# Patient Record
Sex: Male | Born: 1990 | Race: White | Hispanic: No | Marital: Single | Smoking: Never smoker
Health system: Southern US, Community
[De-identification: ages and names within clinical notes are randomized; demographics above are authoritative.]

---

## 2011-03-15 ENCOUNTER — Emergency Department (HOSPITAL_COMMUNITY)
Admission: EM | Admit: 2011-03-15 | Discharge: 2011-03-15 | Disposition: A | Discharge status: Nursing Home (Includes ICF) | Payer: BC Managed Care – PPO | Source: Home / Self Care

## 2011-03-15 ENCOUNTER — Encounter: Payer: Self-pay | Admitting: *Deleted

## 2011-03-15 ENCOUNTER — Encounter (HOSPITAL_COMMUNITY): Payer: Self-pay | Admitting: *Deleted

## 2011-03-15 ENCOUNTER — Emergency Department (HOSPITAL_COMMUNITY): Payer: BC Managed Care – PPO

## 2011-03-15 ENCOUNTER — Emergency Department (HOSPITAL_COMMUNITY)
Admission: EM | Admit: 2011-03-15 | Discharge: 2011-03-15 | Disposition: A | Discharge status: Home / Self Care | Payer: BC Managed Care – PPO | Attending: Emergency Medicine | Admitting: Emergency Medicine

## 2011-03-15 DIAGNOSIS — K1120 Sialoadenitis, unspecified: Secondary | ICD-10-CM | POA: Insufficient documentation

## 2011-03-15 DIAGNOSIS — R221 Localized swelling, mass and lump, neck: Secondary | ICD-10-CM

## 2011-03-15 DIAGNOSIS — R51 Headache: Secondary | ICD-10-CM | POA: Insufficient documentation

## 2011-03-15 DIAGNOSIS — R220 Localized swelling, mass and lump, head: Secondary | ICD-10-CM | POA: Insufficient documentation

## 2011-03-15 DIAGNOSIS — J3489 Other specified disorders of nose and nasal sinuses: Secondary | ICD-10-CM | POA: Insufficient documentation

## 2011-03-15 DIAGNOSIS — R5381 Other malaise: Secondary | ICD-10-CM | POA: Insufficient documentation

## 2011-03-15 DIAGNOSIS — R509 Fever, unspecified: Secondary | ICD-10-CM | POA: Insufficient documentation

## 2011-03-15 DIAGNOSIS — R259 Unspecified abnormal involuntary movements: Secondary | ICD-10-CM | POA: Insufficient documentation

## 2011-03-15 MED ORDER — HYDROCODONE-ACETAMINOPHEN 5-325 MG PO TABS
ORAL_TABLET | ORAL | Status: AC
  Filled 2011-03-15: qty 1

## 2011-03-15 MED ORDER — HYDROCODONE-ACETAMINOPHEN 5-500 MG PO TABS: 1.0000 | ORAL_TABLET | Freq: Four times a day (QID) | ORAL | Status: AC | PRN

## 2011-03-15 MED ORDER — AMOXICILLIN-POT CLAVULANATE 875-125 MG PO TABS: 1.0000 | ORAL_TABLET | Freq: Two times a day (BID) | ORAL | Status: AC

## 2011-03-15 MED ORDER — HYDROCODONE-ACETAMINOPHEN 5-325 MG PO TABS
2.0000 | ORAL_TABLET | Freq: Once | ORAL | Status: AC
  Administered 2011-03-15: 2 via ORAL

## 2011-03-15 MED ORDER — AMOXICILLIN-POT CLAVULANATE 875-125 MG PO TABS
1.0000 | ORAL_TABLET | ORAL | Status: AC
  Administered 2011-03-15: 1 via ORAL
  Filled 2011-03-15 (×2): qty 1

## 2011-03-15 MED ORDER — IOHEXOL 300 MG/ML  SOLN
75.0000 mL | Freq: Once | INTRAMUSCULAR | Status: AC | PRN
  Administered 2011-03-15: 75 mL via INTRAVENOUS

## 2011-03-15 NOTE — ED Notes (Signed)
Family at bedside. 

## 2011-03-15 NOTE — ED Notes (Signed)
Patient reports he has not had much sleep recently,  He has had some cold sx,  He noted onset of some pain on yesterday,  Today he has throbbing pain and swelling on the left side of his face/jaw.  Patient states he does pop his jaw often.  Patient was sent from our ucc for further eval

## 2011-03-15 NOTE — ED Provider Notes (Signed)
History     CSN: [id number] Arrival date & time: 03/15/2011  8:57 AM   First MD Initiated Contact with Patient 03/15/11 [phone]      Chief Complaint  Patient presents with  . 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 Here c/o left jaw pain and swelling, he "pops" both his jaws every morning. But the swelling and pain were first notice at wake up today. Pain 7/10 radiating to left ear. No ear discharge, no fever or chills has been feeling tired and has experienced mild sore throat and runny nose for few days before today. No pain in floor of the mouth. No problems with dry mouth or making saliva. No similar events in the past. Has not taken any pain or antiinflammatory medications in the last 2 days.  The history is provided by the patient.    History reviewed. No pertinent past medical history.  History reviewed. No pertinent past surgical history.  No family history on file.  History  Substance Use Topics  . Smoking status: Never Smoker   . Smokeless tobacco: Not on file  . Alcohol Use: Yes     occassional      Review of Systems  Constitutional: Positive for fatigue. Negative for fever, chills, activity change, appetite change and unexpected weight change.  HENT: Positive for sore throat, facial swelling, rhinorrhea and neck pain. Negative for ear pain, trouble swallowing, neck stiffness, dental problem, sinus pressure and ear discharge.        Difficulty opening his mouth.  Respiratory: Negative for cough, chest tightness, shortness of breath and wheezing.   Gastrointestinal: Negative for nausea, vomiting, abdominal pain and diarrhea.  Musculoskeletal:       Left TMJ pain  Skin: Negative for rash.  Neurological: Positive for headaches. Negative for dizziness.    Allergies  Review of patient's allergies  indicates no known allergies.  Home Medications  No current outpatient prescriptions on file.  BP 150/88  Pulse 97  Temp(Src) 99.2 F (37.3 C) (Oral)  Resp 19  SpO2 97%  Physical Exam  Nursing note and vitals reviewed. Constitutional: He is oriented to person, place, and time. He appears well-developed and well-nourished. No distress.  HENT:  Head: Normocephalic. There is trismus in the jaw.  Right Ear: Hearing, tympanic membrane, external ear and ear canal normal.  Left Ear: Hearing, tympanic membrane, external ear and ear canal normal.  Nose: Nose normal. Right sinus exhibits no maxillary sinus tenderness and no frontal sinus tenderness. Left sinus exhibits no maxillary sinus tenderness and no frontal sinus tenderness.  Mouth/Throat: Uvula is midline and mucous membranes are normal. Posterior oropharyngeal erythema present. No oropharyngeal exudate or posterior oropharyngeal edema.       Mouth floor explored and no salivary ducts stones, lessions or masses felt.  Neck: Trachea normal and normal range of motion. Neck supple. Carotid bruit is not present. No Brudzinski's sign and no Kernig's sign noted. No thyromegaly present.    Cardiovascular: Normal rate, regular rhythm and normal heart sounds.   Pulmonary/Chest: He is in respiratory distress. He has no wheezes. He has rales.  Abdominal: Soft. There is no tenderness.  Musculoskeletal:       Left TMJ pain and trisms  Neurological: He is alert and oriented to person, place, and time.  Skin: No rash noted.    ED Course  Procedures (including critical care time)  Labs Reviewed - No data to display No results found.   No diagnosis found.    MDM  Left side neck mass vs adenopathy vs TMJ swelling also concerning for infectious parotitis although pt is afebrile. Likely pt will require a CT for better characterization.Pt. Was transferred to [facility] ED for further evaluation.        Sharin Grave, MD 03/19/11 [phone]

## 2011-03-15 NOTE — ED Notes (Signed)
Patient did receive vicodin at ucc

## 2011-03-15 NOTE — ED Notes (Signed)
Patient noticed slight swelling left side of face and neck one day ago and today swelling increased. Pain achy 4/10 with airway intact and bilateral equal chest rise and fall.  Family members at bedside

## 2011-03-15 NOTE — ED Provider Notes (Signed)
History     CSN: [id number] Arrival date & time: 03/15/2011 10:34 AM   First MD Initiated Contact with Patient 03/15/11 1102      Chief Complaint  Patient presents with  . Facial Swelling  . Headache    (Consider location/radiation/quality/duration/timing/severity/associated sxs/prior treatment) Patient is a 20 y.o. male presenting with headaches.  Headache    Patient is a 20 year old male who presents today complaining of having had recent viral upper respiratory symptoms with development of left-sided facial swelling this morning. Upon waking patient noted that he had tender left sided facial swelling anterior to the left ear and extending down to the angle of the mandible. Patient has not had anything like this previously. He has no odontogenic infections. He has no mouth pain. There is no difficulty with swallowing or breathing. He has mild tenderness to palpation. There is no nuchal rigidity. He has mild trismus. Patient was seen initially at urgent care and was referred to [place] for further management. He's had no fevers or sick contacts. There are no other associated or modifying factors. History reviewed. No pertinent past medical history.  History reviewed. No pertinent past surgical history.  No family history on file.  History  Substance Use Topics  . Smoking status: Never Smoker   . Smokeless tobacco: Not on file  . Alcohol Use: Yes     occassional      Review of Systems  Constitutional: Positive for fatigue.  HENT: Positive for congestion and facial swelling. Negative for neck pain and neck stiffness.   Eyes: Negative.   Respiratory: Negative.   Cardiovascular: Negative.   Gastrointestinal: Negative.   Genitourinary: Negative.   Musculoskeletal: Negative.   Skin: Negative.   Neurological: Positive for headaches.  Hematological: Negative.   Psychiatric/Behavioral: Negative.   All other systems reviewed and are negative.    Allergies  Review of patient's  allergies indicates no known allergies.  Home Medications   Current Outpatient Rx  Name Route Sig Dispense Refill  . AMOXICILLIN-POT CLAVULANATE 875-125 MG PO TABS Oral Take 1 tablet by mouth 2 (two) times daily. 20 tablet 0  . HYDROCODONE-ACETAMINOPHEN 5-500 MG PO TABS Oral Take 1-2 tablets by mouth every 6 (six) hours as needed for pain. 15 tablet 0    BP 143/92  Pulse 92  Temp(Src) 98.8 F (37.1 C) (Oral)  Resp 16  Ht 5\' 10"  (1.778 m)  Wt 180 lb (81.647 kg)  BMI 25.83 kg/m2  SpO2 97%  Physical Exam  Nursing note and vitals reviewed. Constitutional: He is oriented to person, place, and time. He appears well-developed and well-nourished. No distress.  HENT:  Head: Normocephalic.       Patient with left-sided facial swelling noted anterior to the left ear and extending down to the angle of the mandible. Patient has no overlying erythema or induration. Patient's oropharynx is clear with no signs of dental caries. He has no abnormalities of the posterior oropharynx other than mild erythema. Patient also complains of some tenderness under the tongue. No salivary duct stones are visualized. None are palpated. TMs appear normal bilaterally.  Eyes: Conjunctivae and EOM are normal. Pupils are equal, round, and reactive to light.  Neck: Normal range of motion.  Cardiovascular: Normal rate, regular rhythm, normal heart sounds and intact distal pulses.  Exam reveals no gallop and no friction rub.   No murmur heard. Pulmonary/Chest: Effort normal and breath sounds normal. No respiratory distress. He has no wheezes. He has no rales.  Abdominal: Soft.  Bowel sounds are normal. He exhibits no distension. There is no tenderness. There is no rebound and no guarding.  Musculoskeletal: Normal range of motion.  Neurological: He is alert and oriented to person, place, and time. No cranial nerve deficit. He exhibits normal muscle tone. Coordination normal.  Skin: Skin is warm and dry. No rash noted.    Psychiatric: He has a normal mood and affect.    ED Course  Procedures (including critical care time)  Labs Reviewed - No data to display Ct Soft Tissue Neck W Contrast  03/15/2011  *RADIOLOGY REPORT*  Clinical Data: Left-sided facial swelling low grade fever  CT NECK WITH CONTRAST  Technique:  Multidetector CT imaging of the neck was performed with intravenous contrast.  Contrast: 75mL OMNIPAQUE IOHEXOL 300 MG/ML IV SOLN  Comparison: None.  Findings: Left parotid gland is enlarged and shows increased enhancement diffusely.  There is surrounding subcutaneous edema. The findings are compatible with parotitis.  No abscess or mass is seen.  No parotid calculi.  The right parotid gland is normal.  Submandibular gland is normal bilaterally.  Tonsillar tissue is prominent but symmetric bilaterally.  Left level II lymph node measures 13 mm.  Left level II node measures 12 mm.  Small left level V nodes are present.  Right level II node measures 15 mm.  Thyroid gland is normal.  Lung apices are clear.  No acute bony abnormality.  IMPRESSION: Findings compatible with parotitis on the left.  No abscess or mass.  Cervical lymphadenopathy is likely reactive given the patient's age and parotitis.  Original Report Authenticated By: Camelia Phenes, M.D.     1. Parotitis       MDM  Patient was evaluated by myself. Given his viral prodrome as well as the location of his symptoms patient may have an isolated viral parotiditis. Due to the possibility of bacterial involvement or other possible pathology patient is having a CT neck with IV contrast. Patient had received Vicodin at urgent care. He denies needing any other medication at this time.  Patient had CT consistent with parotitis with no abscess or mass.  Patient was given Augmentin 875 mg here. He will be discharged with a ten-day course of this as well as pain medications. He was instructed to use lemon heads or other parts ED stay and patient stated  understanding was discharged in good condition. He was given the name of ENT and contact information if he continues to have swelling her pain following 10 days. Patient was discharged home in good condition.       Cyndra Numbers, MD 03/15/11 [phone]

## 2011-03-15 NOTE — ED Notes (Signed)
Patient is resting comfortably. 

## 2011-03-15 NOTE — ED Notes (Signed)
Pt c/o left jaw pain and swelling at TMJ.  States he noticed the "muscle" at his TMJ being sore yesterday.  States he "pops" his jaw frequently and has done this for a long time.  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  Today left jaw very swollen, unable to open mouth wide.  States it hurts into his left ear.  No fever that he is aware of.  Has had a scratchy throat and runny nose.

## 2012-05-26 IMAGING — CT CT NECK W/ CM
4 of 5 series · 15 of 33 positions shown, 17 images · IV contrast (APPLIED)
Comparison: None.

CLINICAL DATA: Left-sided facial swelling low grade fever

CT NECK WITH CONTRAST
TECHNIQUE: Multidetector CT imaging of the neck was performed with
intravenous contrast.
Contrast: 75mL OMNIPAQUE IOHEXOL 300 MG/ML IV SOLN

[Series 3: st neck 2.0 b31s · axial · 0.43mm/px · z∈[-246,-74]mm · 4 of 144 slices shown, 5 images]
[im 29/144  soft-tissue]
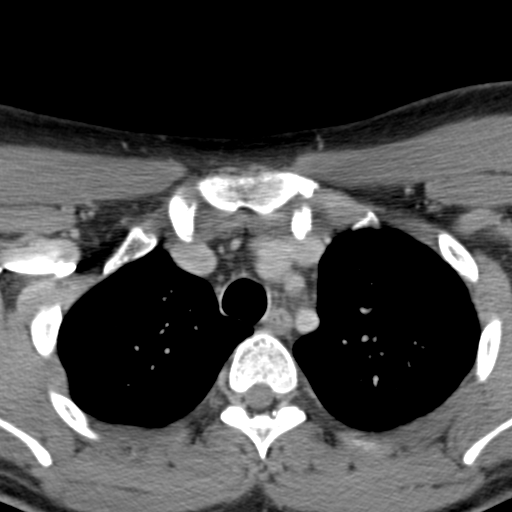
[im 29/144  bone]
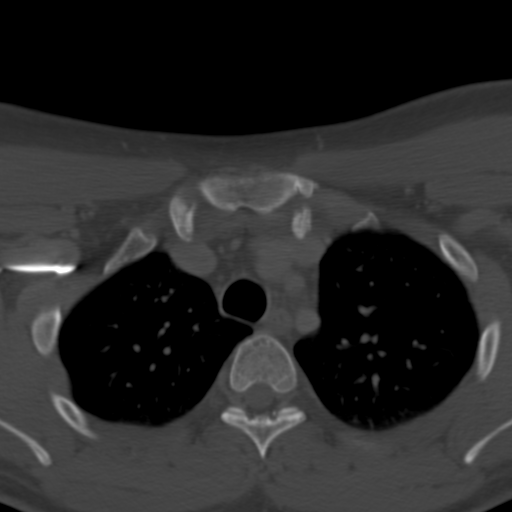
[im 58/144  bone]
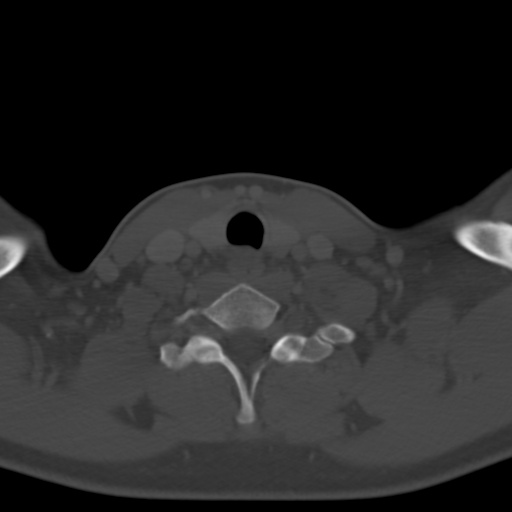
[im 86/144  bone]
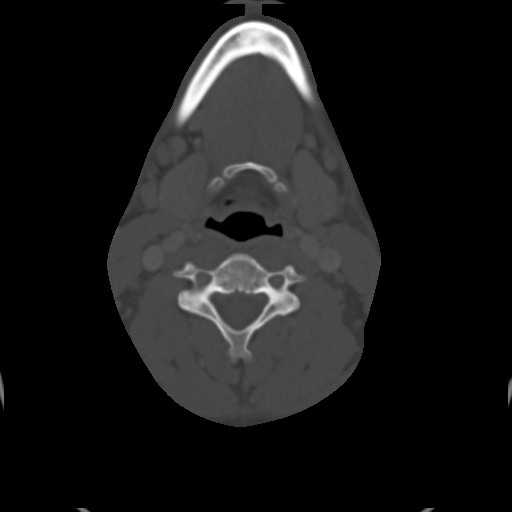
[im 115/144  bone]
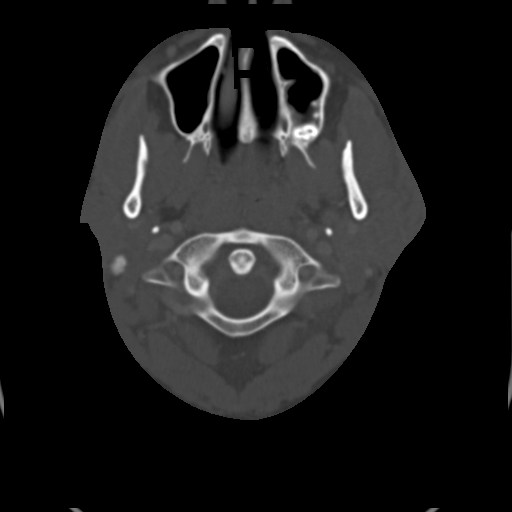

[Series 6: coronals · coronal · 0.53mm/px · 3 of 90 slices shown]
[im 18/90  bone]
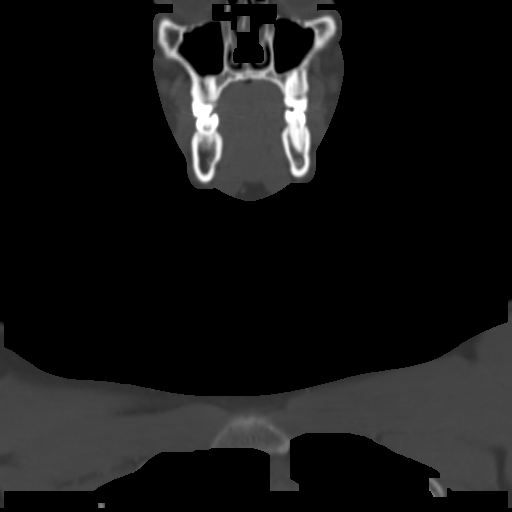
[im 36/90  bone]
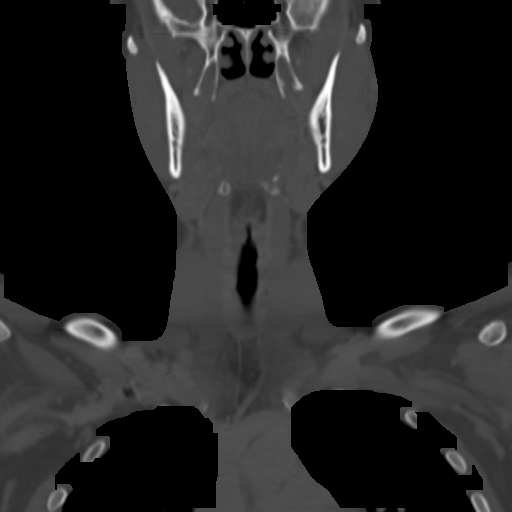
[im 54/90  bone]
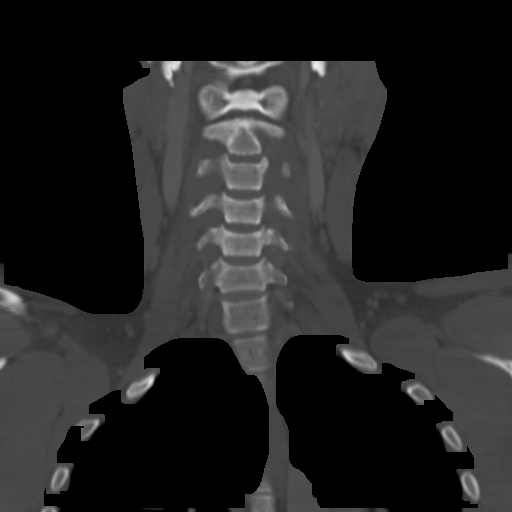

[Series 7: sagittals · sagittal · 0.55mm/px · 5 of 100 slices shown, 6 images]
[im 34/100  bone]
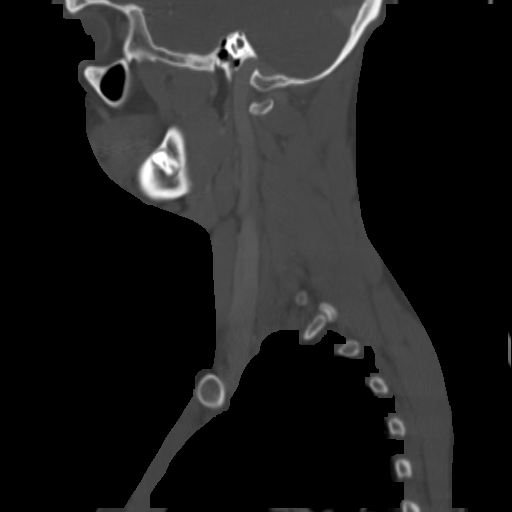
[im 42/100  bone]
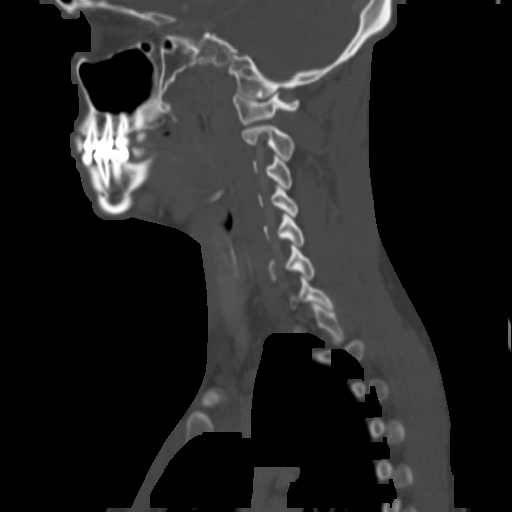
[im 50/100  soft-tissue]
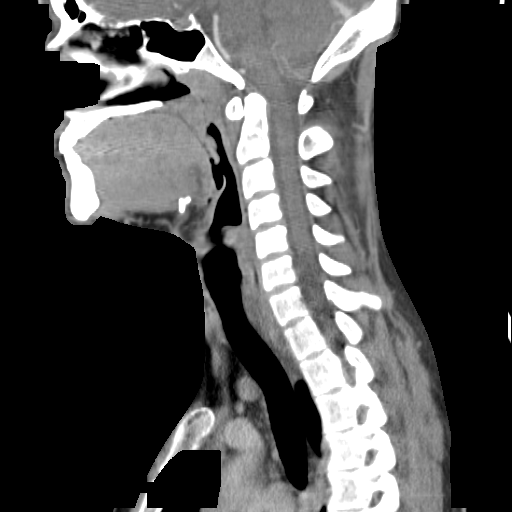
[im 50/100  bone]
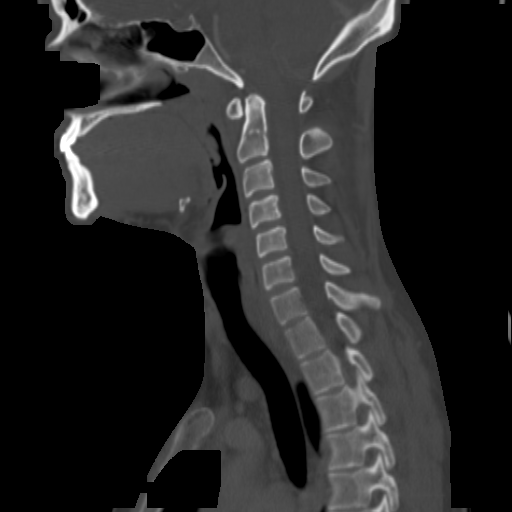
[im 58/100  bone]
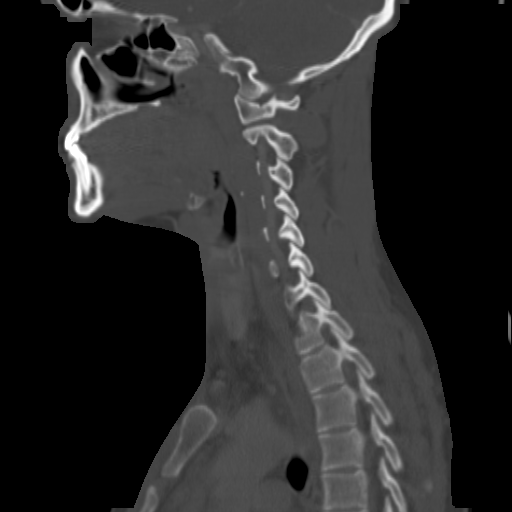
[im 67/100  bone]
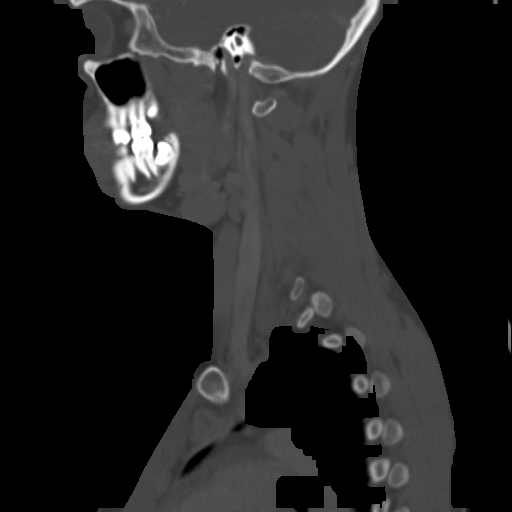

[Series 8: orthogonals · axial · 0.40mm/px · z∈[-245,-121]mm · 3 of 130 slices shown]
[im 33/130  bone]
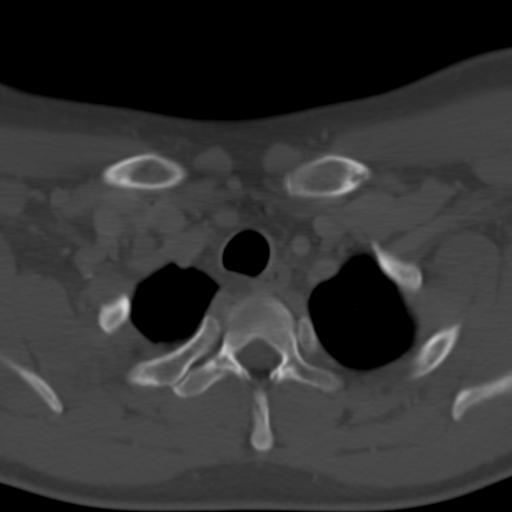
[im 65/130  bone]
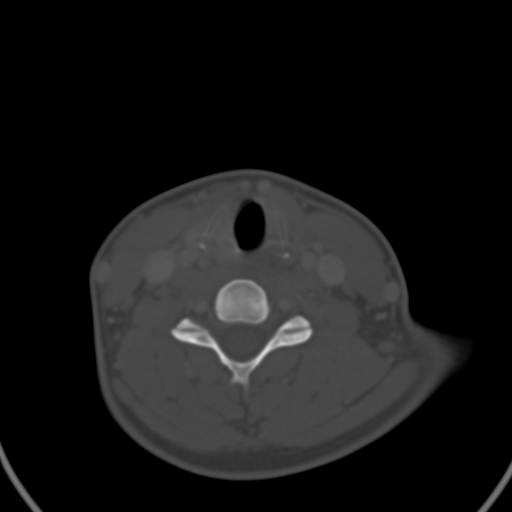
[im 97/130  bone]
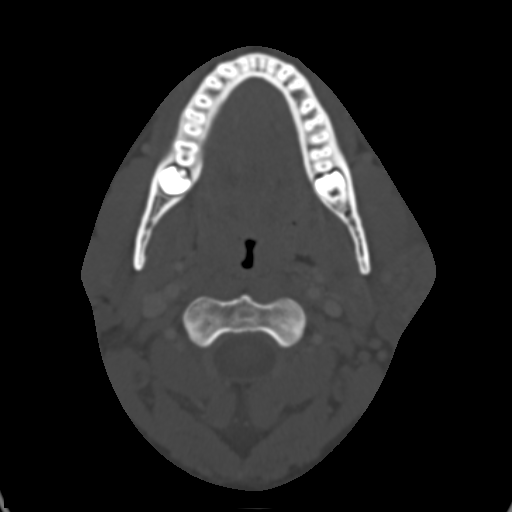

[15 of 33 positions shown; findings below may reference images not displayed]

FINDINGS: Left parotid gland is enlarged and shows increased
enhancement diffusely.  There is surrounding subcutaneous edema.
The findings are compatible with parotitis.  No abscess or mass is
seen.  No parotid calculi.

The right parotid gland is normal.  Submandibular gland is normal
bilaterally.  Tonsillar tissue is prominent but symmetric
bilaterally.

Left level II lymph node measures 13 mm.  Left level II node
measures 12 mm.  Small left level V nodes are present.  Right level
II node measures 15 mm.

Thyroid gland is normal.  Lung apices are clear.  No acute bony
abnormality.
IMPRESSION: Findings compatible with parotitis on the left.  No abscess or
mass.  Cervical lymphadenopathy is likely reactive given the
patient's age and parotitis.

## 2018-04-16 ENCOUNTER — Ambulatory Visit (HOSPITAL_COMMUNITY)
Admission: EM | Admit: 2018-04-16 | Discharge: 2018-04-16 | Disposition: A | Discharge status: Home / Self Care | Payer: BLUE CROSS/BLUE SHIELD | Attending: Family Medicine | Admitting: Family Medicine

## 2018-04-16 ENCOUNTER — Other Ambulatory Visit: Payer: Self-pay

## 2018-04-16 ENCOUNTER — Encounter (HOSPITAL_COMMUNITY): Payer: Self-pay | Admitting: Emergency Medicine

## 2018-04-16 DIAGNOSIS — W260XXA Contact with knife, initial encounter: Secondary | ICD-10-CM | POA: Diagnosis not present

## 2018-04-16 DIAGNOSIS — Z23 Encounter for immunization: Secondary | ICD-10-CM

## 2018-04-16 DIAGNOSIS — S61412A Laceration without foreign body of left hand, initial encounter: Secondary | ICD-10-CM | POA: Insufficient documentation

## 2018-04-16 MED ORDER — TETANUS-DIPHTH-ACELL PERTUSSIS 5-2.5-18.5 LF-MCG/0.5 IM SUSP
INTRAMUSCULAR | Status: AC
  Filled 2018-04-16: qty 0.5

## 2018-04-16 MED ORDER — TETANUS-DIPHTH-ACELL PERTUSSIS 5-2.5-18.5 LF-MCG/0.5 IM SUSP
0.5000 mL | Freq: Once | INTRAMUSCULAR | Status: AC
  Administered 2018-04-16: 0.5 mL via INTRAMUSCULAR

## 2018-04-16 MED ORDER — CEPHALEXIN 500 MG PO CAPS: 500.0000 mg | ORAL_CAPSULE | Freq: Three times a day (TID) | ORAL | 0 refills | Status: AC

## 2018-04-16 NOTE — ED Provider Notes (Signed)
[facility]    CSN: [id number] Arrival date & time: 04/16/18  1407     History   Chief Complaint Chief Complaint  Patient presents with  . Hand Injury    HPI William Mccarty is a 27 y.o. male.   HPI  Puncture wound to left palm that happened today.  He was shucking oysters and punctured his hand with a knife.  Bleeding is controlled.  He is here because his last tetanus was 2009. Hand was thoroughly washed at home.  Then peroxide was poured into the wound.  Then Neosporin was applied.  He is here with a bandage on History reviewed. No pertinent past medical history.  There are no active problems to display for this patient.   History reviewed. No pertinent surgical history.     Home Medications    Prior to Admission medications   Medication Sig Start Date End Date Taking? Authorizing Provider  cephALEXin (KEFLEX) 500 MG capsule Take 1 capsule (500 mg total) by mouth 3 (three) times daily. 04/16/18   Eustace MooreNelson, Shahla Betsill Sue, MD    Family History No family history on file.  Social History Social History   Tobacco Use  . Smoking status: Never Smoker  Substance Use Topics  . Alcohol use: Yes    Comment: occassional  . Drug use: No     Allergies   Patient has no known allergies.   Review of Systems Review of Systems  Constitutional: Negative for chills and fever.  HENT: Negative for ear pain and sore throat.   Eyes: Negative for pain and visual disturbance.  Respiratory: Negative for cough and shortness of breath.   Cardiovascular: Negative for chest pain and palpitations.  Gastrointestinal: Negative for abdominal pain and vomiting.  Genitourinary: Negative for dysuria and hematuria.  Musculoskeletal: Negative for arthralgias and back pain.  Skin: Positive for wound. Negative for color change and rash.  Neurological: Negative for seizures and syncope.  All other systems reviewed and are negative.    Physical Exam Triage Vital Signs ED Triage  Vitals  Enc Vitals Group     BP 04/16/18 1439 (!) 140/95     Pulse Rate 04/16/18 1439 90     Resp 04/16/18 1439 16     Temp 04/16/18 1439 97.7 F (36.5 C)     Temp Source 04/16/18 1439 Oral     SpO2 04/16/18 1439 99 %     Weight --      Height --      Head Circumference --      Peak Flow --      Pain Score 04/16/18 1440 6     Pain Loc --      Pain Edu? --      Excl. in GC? --    No data found.  Updated Vital Signs BP (!) 140/95 (BP Location: Right Arm)   Pulse 90   Temp 97.7 F (36.5 C) (Oral)   Resp 16   SpO2 99%   Visual Acuity Right Eye Distance:   Left Eye Distance:   Bilateral Distance:    Right Eye Near:   Left Eye Near:    Bilateral Near:     Physical Exam Constitutional:      General: He is not in acute distress.    Appearance: He is well-developed.  HENT:     Head: Normocephalic and atraumatic.  Eyes:     Conjunctiva/sclera: Conjunctivae normal.     Pupils: Pupils are equal, round, and reactive  to light.  Neck:     Musculoskeletal: Normal range of motion.  Cardiovascular:     Rate and Rhythm: Normal rate.  Pulmonary:     Effort: Pulmonary effort is normal. No respiratory distress.  Abdominal:     General: There is no distension.     Palpations: Abdomen is soft.  Musculoskeletal: Normal range of motion.  Skin:    General: Skin is warm and dry.     Comments: Left palm has a 1 cm laceration over the thenar eminence that is now bleeding.  It is closed with Dermabond.  Neurological:     Mental Status: He is alert.      UC Treatments / Results  Labs (all labs ordered are listed, but only abnormal results are displayed) Labs Reviewed - No data to display  EKG None  Radiology No results found.  Procedures Procedures (including critical care time)  Medications Ordered in UC Medications  Tdap (BOOSTRIX) injection 0.5 mL (0.5 mLs Intramuscular Given 04/16/18 1509)    Initial Impression / Assessment and Plan / UC Course  I have  reviewed the triage vital signs and the nursing notes.  Pertinent labs & imaging results that were available during my care of the patient were reviewed by me and considered in my medical decision making (see chart for details).     Discussed wound care.  Because of the puncture wound with a knife that is considered dirty, I am going to put him on 3 to 4 days of antibiotics.  Watch for infection.  Tetanus is updated. Final Clinical Impressions(s) / UC Diagnoses   Final diagnoses:  Laceration of left palm, initial encounter     Discharge Instructions     Take the antibiotic for 3-4 days Return for any redness o rinfection   ED Prescriptions    Medication Sig Dispense Auth. Provider   cephALEXin (KEFLEX) 500 MG capsule Take 1 capsule (500 mg total) by mouth 3 (three) times daily. 12 capsule Eustace MooreNelson, Kohle Winner Sue, MD     Controlled Substance Prescriptions [place] Controlled Substance Registry consulted? Not Applicable   Eustace MooreNelson, Colbert Curenton Sue, MD 04/16/18 [phone]

## 2018-04-16 NOTE — Discharge Instructions (Addendum)
Take the antibiotic for 3-4 days Return for any redness o rinfection

## 2018-04-16 NOTE — ED Triage Notes (Signed)
PT has a laceration to left palm. PT cut it with an oyster knife. PT is not UTD on tetanus.
